# Patient Record
Sex: Female | Born: 1970 | Race: Black or African American | Hispanic: No | Marital: Married | Smoking: Never smoker
Health system: Southern US, Community
[De-identification: ages and names within clinical notes are randomized; demographics above are authoritative.]

## PROBLEM LIST (undated history)

## (undated) DIAGNOSIS — R002 Palpitations: Secondary | ICD-10-CM

## (undated) DIAGNOSIS — A159 Respiratory tuberculosis unspecified: Secondary | ICD-10-CM

## (undated) DIAGNOSIS — D649 Anemia, unspecified: Secondary | ICD-10-CM

## (undated) HISTORY — PX: OTHER SURGICAL HISTORY: SHX169

## (undated) HISTORY — DX: Palpitations: R00.2

## (undated) HISTORY — DX: Respiratory tuberculosis unspecified: A15.9

---

## 2001-01-06 ENCOUNTER — Other Ambulatory Visit: Admission: RE | Admit: 2001-01-06 | Discharge: 2001-01-06 | Payer: Self-pay | Admitting: Obstetrics and Gynecology

## 2001-02-28 ENCOUNTER — Encounter: Payer: Self-pay | Admitting: Obstetrics and Gynecology

## 2001-02-28 ENCOUNTER — Ambulatory Visit (HOSPITAL_COMMUNITY): Admission: RE | Admit: 2001-02-28 | Discharge: 2001-02-28 | Payer: Self-pay | Admitting: Obstetrics and Gynecology

## 2001-04-24 ENCOUNTER — Emergency Department (HOSPITAL_COMMUNITY): Admission: EM | Admit: 2001-04-24 | Discharge: 2001-04-24 | Payer: Self-pay | Admitting: Emergency Medicine

## 2001-06-01 ENCOUNTER — Ambulatory Visit (HOSPITAL_COMMUNITY): Admission: RE | Admit: 2001-06-01 | Discharge: 2001-06-01 | Payer: Self-pay | Admitting: Obstetrics and Gynecology

## 2001-06-01 ENCOUNTER — Encounter: Payer: Self-pay | Admitting: Obstetrics and Gynecology

## 2001-07-23 ENCOUNTER — Inpatient Hospital Stay (HOSPITAL_COMMUNITY): Admission: AD | Admit: 2001-07-23 | Discharge: 2001-07-25 | Payer: Self-pay | Admitting: Obstetrics and Gynecology

## 2005-10-07 ENCOUNTER — Other Ambulatory Visit: Admission: RE | Admit: 2005-10-07 | Discharge: 2005-10-07 | Payer: Self-pay | Admitting: Obstetrics and Gynecology

## 2010-12-05 ENCOUNTER — Ambulatory Visit (HOSPITAL_COMMUNITY)
Admission: RE | Admit: 2010-12-05 | Discharge: 2010-12-05 | Payer: Self-pay | Source: Home / Self Care | Attending: Obstetrics and Gynecology | Admitting: Obstetrics and Gynecology

## 2011-01-20 ENCOUNTER — Encounter
Admission: RE | Admit: 2011-01-20 | Discharge: 2011-01-20 | Payer: Self-pay | Source: Home / Self Care | Attending: Obstetrics and Gynecology | Admitting: Obstetrics and Gynecology

## 2012-03-09 ENCOUNTER — Encounter (INDEPENDENT_AMBULATORY_CARE_PROVIDER_SITE_OTHER): Payer: Self-pay | Admitting: Obstetrics and Gynecology

## 2012-03-09 DIAGNOSIS — N898 Other specified noninflammatory disorders of vagina: Secondary | ICD-10-CM

## 2012-10-18 ENCOUNTER — Other Ambulatory Visit: Payer: Self-pay | Admitting: Obstetrics and Gynecology

## 2012-10-18 DIAGNOSIS — Z1231 Encounter for screening mammogram for malignant neoplasm of breast: Secondary | ICD-10-CM

## 2012-10-19 ENCOUNTER — Encounter: Payer: Self-pay | Admitting: Obstetrics and Gynecology

## 2012-10-19 ENCOUNTER — Ambulatory Visit (INDEPENDENT_AMBULATORY_CARE_PROVIDER_SITE_OTHER): Payer: Self-pay | Admitting: Obstetrics and Gynecology

## 2012-10-19 VITALS — BP 140/80 | HR 60 | Ht 62.0 in | Wt 185.0 lb

## 2012-10-19 DIAGNOSIS — Z124 Encounter for screening for malignant neoplasm of cervix: Secondary | ICD-10-CM

## 2012-10-19 NOTE — Progress Notes (Signed)
Last Pap: 10/14/11 WNL: Yes Regular Periods:yes Contraception: none  Monthly Breast exam:yes Tetanus<32yrs:no pt unsure Nl.Bladder Function:yes Daily BMs:no Healthy Diet:yes Calcium:yes in multivitamin  Mammogram:yes Date of Mammogram: 01/20/11 Exercise:yes Have often Exercise: daily  Seatbelt: yes Abuse at home: no Stressful work:no Sigmoid-colonoscopy: n/a Bone Density: No PCP: none Change in PMH: none Change in [facility]: none BP 140/80  Pulse 60  Ht 5\' 2"  (1.575 m)  Wt 185 lb (83.915 kg)  BMI 33.84 kg/m2  LMP 10/05/2012 Pt with complaints:no Physical Examination: General appearance - alert, well appearing, and in no distress Mental status - normal mood, behavior, speech, dress, motor activity, and thought processes Neck - supple, no significant adenopathy,  thyroid exam: thyroid is normal in size without nodules or tenderness Chest - clear to auscultation, no wheezes, rales or rhonchi, symmetric air entry Heart - normal rate and regular rhythm Abdomen - soft, nontender, nondistended, no masses or organomegaly Breasts - breasts appear normal, no suspicious masses, no skin or nipple changes or axillary nodes Pelvic - normal external genitalia, vulva, vagina, cervix, uterus and adnexa Rectal - rectal exam not indicated Back exam - full range of motion, no tenderness, palpable spasm or pain on motion Neurological - alert, oriented, normal speech, no focal findings or movement disorder noted Musculoskeletal - no joint tenderness, deformity or swelling Extremities - no edema, redness or tenderness in the calves or thighs Skin - normal coloration and turgor, no rashes, no suspicious skin lesions noted Routine exam Pap sent yes Mammogram due 1/14 nothing used for contraception.  Pt abstinant RT 1 yr

## 2012-10-20 LAB — PAP IG W/ RFLX HPV ASCU

## 2013-02-01 ENCOUNTER — Encounter (HOSPITAL_COMMUNITY): Payer: Self-pay | Admitting: *Deleted

## 2013-02-07 ENCOUNTER — Encounter (HOSPITAL_COMMUNITY): Payer: Self-pay

## 2013-02-07 ENCOUNTER — Other Ambulatory Visit: Payer: Self-pay | Admitting: Obstetrics and Gynecology

## 2013-02-07 ENCOUNTER — Ambulatory Visit (HOSPITAL_COMMUNITY)
Admission: RE | Admit: 2013-02-07 | Discharge: 2013-02-07 | Disposition: A | Discharge status: Home / Self Care | Payer: Self-pay | Source: Ambulatory Visit | Attending: Obstetrics and Gynecology | Admitting: Obstetrics and Gynecology

## 2013-02-07 VITALS — BP 120/72 | Temp 98.6°F | Ht 62.0 in | Wt 180.0 lb

## 2013-02-07 DIAGNOSIS — Z1239 Encounter for other screening for malignant neoplasm of breast: Secondary | ICD-10-CM

## 2013-02-07 DIAGNOSIS — N63 Unspecified lump in unspecified breast: Secondary | ICD-10-CM

## 2013-02-07 DIAGNOSIS — N6311 Unspecified lump in the right breast, upper outer quadrant: Secondary | ICD-10-CM | POA: Insufficient documentation

## 2013-02-07 DIAGNOSIS — R922 Inconclusive mammogram: Secondary | ICD-10-CM

## 2013-02-07 HISTORY — DX: Anemia, unspecified: D64.9

## 2013-02-07 NOTE — Patient Instructions (Signed)
Taught patient how to perform BSE. Patient did not need a Pap smear today due to last Pap smear was 10/19/2012. Let her know BCCCP will cover Pap smears every 3 years unless has a history of abnormal Pap smears. Referred patient to the [facility] for bilateral diagnostic mammogram and right breast ultrasound. Appointment scheduled for Wednesday, February 15, 2013 at 0910. Patient aware of appointment and will be there. Patient verbalized understanding.

## 2013-02-07 NOTE — Progress Notes (Signed)
No complaints today.  Pap Smear:    Pap smear not completed today. Last Pap smear was 10/19/2012 and normal. Per patient no history of abnormal Pap smears. Pap smear result above is in EPIC.  Physical exam: Breasts Breasts symmetrical. No skin abnormalities bilateral breasts. No nipple retraction bilateral breasts. No nipple discharge bilateral breasts. No lymphadenopathy. No lumps palpated left breast. Palpated small pea sized lump in right breast at 10 o'clock around 6 cm from nipple. No complaints of pain or tenderness on exam. Referred patient to the [facility] for bilateral diagnostic mammogram and right breast ultrasound. Appointment scheduled for Wednesday, February 15, 2013 at 0910.     Pelvic/Bimanual No Pap smear completed today since last Pap smear was 10/19/2012. Pap smear not indicated per BCCCP guidelines.

## 2013-02-15 ENCOUNTER — Ambulatory Visit
Admission: RE | Admit: 2013-02-15 | Discharge: 2013-02-15 | Disposition: A | Discharge status: Home / Self Care | Payer: No Typology Code available for payment source | Source: Ambulatory Visit | Attending: Obstetrics and Gynecology | Admitting: Obstetrics and Gynecology

## 2013-02-15 DIAGNOSIS — R922 Inconclusive mammogram: Secondary | ICD-10-CM

## 2013-02-15 DIAGNOSIS — N63 Unspecified lump in unspecified breast: Secondary | ICD-10-CM

## 2014-01-09 ENCOUNTER — Other Ambulatory Visit: Payer: Self-pay | Admitting: Obstetrics and Gynecology

## 2014-01-26 ENCOUNTER — Other Ambulatory Visit: Payer: Self-pay | Admitting: Obstetrics and Gynecology

## 2014-01-26 DIAGNOSIS — Z1231 Encounter for screening mammogram for malignant neoplasm of breast: Secondary | ICD-10-CM

## 2014-02-16 ENCOUNTER — Ambulatory Visit (HOSPITAL_COMMUNITY): Payer: Self-pay

## 2014-02-16 ENCOUNTER — Other Ambulatory Visit: Payer: Self-pay | Admitting: Obstetrics and Gynecology

## 2014-02-16 ENCOUNTER — Ambulatory Visit (HOSPITAL_COMMUNITY)
Admission: RE | Admit: 2014-02-16 | Discharge: 2014-02-16 | Disposition: A | Discharge status: Home / Self Care | Payer: Self-pay | Source: Ambulatory Visit | Attending: Obstetrics and Gynecology | Admitting: Obstetrics and Gynecology

## 2014-02-16 DIAGNOSIS — Z1231 Encounter for screening mammogram for malignant neoplasm of breast: Secondary | ICD-10-CM

## 2014-08-29 ENCOUNTER — Telehealth (HOSPITAL_COMMUNITY): Payer: Self-pay | Admitting: *Deleted

## 2014-08-29 NOTE — Telephone Encounter (Signed)
Patient called stating she received a bill for her mammogram on 02/16/2014. Let patient know that the bill would not be covered by BCCCP due to her BCCCP card expired and she was not seen in [place] clinic prior to mammogram. Patient received a mammogram scholarship. Gave her the phone number to Radiology at [facility] to follow-up on bill. Patient verbalized understanding.

## 2014-10-22 ENCOUNTER — Encounter (HOSPITAL_COMMUNITY): Payer: Self-pay

## 2015-02-26 ENCOUNTER — Other Ambulatory Visit (HOSPITAL_COMMUNITY): Payer: Self-pay | Admitting: Obstetrics and Gynecology

## 2015-02-26 DIAGNOSIS — Z1231 Encounter for screening mammogram for malignant neoplasm of breast: Secondary | ICD-10-CM

## 2015-03-06 ENCOUNTER — Ambulatory Visit (HOSPITAL_COMMUNITY)
Admission: RE | Admit: 2015-03-06 | Discharge: 2015-03-06 | Disposition: A | Discharge status: Home / Self Care | Payer: Self-pay | Source: Ambulatory Visit | Attending: Obstetrics and Gynecology | Admitting: Obstetrics and Gynecology

## 2015-03-06 DIAGNOSIS — Z1231 Encounter for screening mammogram for malignant neoplasm of breast: Secondary | ICD-10-CM

## 2016-08-26 ENCOUNTER — Other Ambulatory Visit: Payer: Self-pay | Admitting: Internal Medicine

## 2016-08-26 DIAGNOSIS — Z1231 Encounter for screening mammogram for malignant neoplasm of breast: Secondary | ICD-10-CM

## 2016-08-27 ENCOUNTER — Ambulatory Visit
Admission: RE | Admit: 2016-08-27 | Discharge: 2016-08-27 | Disposition: A | Discharge status: Home / Self Care | Payer: No Typology Code available for payment source | Source: Ambulatory Visit | Attending: Internal Medicine | Admitting: Internal Medicine

## 2016-08-27 DIAGNOSIS — Z1231 Encounter for screening mammogram for malignant neoplasm of breast: Secondary | ICD-10-CM

## 2016-08-31 ENCOUNTER — Other Ambulatory Visit: Payer: Self-pay | Admitting: Internal Medicine

## 2016-08-31 DIAGNOSIS — R928 Other abnormal and inconclusive findings on diagnostic imaging of breast: Secondary | ICD-10-CM

## 2016-09-14 ENCOUNTER — Other Ambulatory Visit (HOSPITAL_COMMUNITY): Payer: Self-pay | Admitting: *Deleted

## 2016-09-14 DIAGNOSIS — R928 Other abnormal and inconclusive findings on diagnostic imaging of breast: Secondary | ICD-10-CM

## 2016-09-15 ENCOUNTER — Other Ambulatory Visit (HOSPITAL_COMMUNITY): Payer: Self-pay | Admitting: *Deleted

## 2016-09-15 DIAGNOSIS — R928 Other abnormal and inconclusive findings on diagnostic imaging of breast: Secondary | ICD-10-CM

## 2016-09-21 ENCOUNTER — Other Ambulatory Visit: Payer: No Typology Code available for payment source

## 2016-09-24 ENCOUNTER — Ambulatory Visit
Admission: RE | Admit: 2016-09-24 | Discharge: 2016-09-24 | Disposition: A | Discharge status: Home / Self Care | Payer: No Typology Code available for payment source | Source: Ambulatory Visit | Attending: Obstetrics and Gynecology | Admitting: Obstetrics and Gynecology

## 2016-09-24 ENCOUNTER — Ambulatory Visit (HOSPITAL_COMMUNITY)
Admission: RE | Admit: 2016-09-24 | Discharge: 2016-09-24 | Disposition: A | Discharge status: Home / Self Care | Payer: Self-pay | Source: Ambulatory Visit | Attending: Obstetrics and Gynecology | Admitting: Obstetrics and Gynecology

## 2016-09-24 ENCOUNTER — Encounter (HOSPITAL_COMMUNITY): Payer: Self-pay

## 2016-09-24 VITALS — BP 118/76 | Temp 99.0°F | Ht 62.0 in | Wt 179.8 lb

## 2016-09-24 DIAGNOSIS — R928 Other abnormal and inconclusive findings on diagnostic imaging of breast: Secondary | ICD-10-CM

## 2016-09-24 DIAGNOSIS — Z01419 Encounter for gynecological examination (general) (routine) without abnormal findings: Secondary | ICD-10-CM

## 2016-09-24 NOTE — Patient Instructions (Signed)
Explained breast self awareness to Alicia Cameron. Let patient know that BCCCP will cover Pap smears and HPV typing every 5 years unless has a history of abnormal Pap smears. Referred patient to the [facility] for a right breast diagnostic mammogram per recommendation. Appointment scheduled for Thursday, September 24, 2016 at 1440. Let patient know will follow up with her within the next couple weeks with results of Pap smear by phone. Alicia Cameron verbalized understanding.  Chevelle Durr, Arvil Chaco, RN 3:13 PM

## 2016-09-24 NOTE — Progress Notes (Signed)
.  pap

## 2016-09-24 NOTE — Progress Notes (Signed)
Patient referred to [facility] by the [facility] due to recommending additional imagine of the right breast. Screening mammogram completed 08/27/2016.  Pap Smear: Pap smear completed today. Last Pap smear was 10/19/2012 at [facility] and normal. Per patient has no history of an abnormal Pap smear. Last Pap smear result is in EPIC.  Physical exam: Breasts Breasts symmetrical. No skin abnormalities bilateral breasts. No nipple retraction bilateral breasts. No nipple discharge bilateral breasts. No lymphadenopathy. No lumps palpated bilateral breasts. No complaints of pain or tenderness on exam. Referred patient to the [facility] for a right breast diagnostic mammogram per recommendation. Appointment scheduled for Thursday, September 24, 2016 at 1440.  Pelvic/Bimanual   Ext Genitalia No lesions, no swelling and no discharge observed on external genitalia.         Vagina Vagina pink and normal texture. No lesions or discharge observed in vagina.          Cervix Cervix is present. Cervix pink and of normal texture. No discharge observed.     Uterus Uterus is present and palpable. Uterus in normal position and normal size.        Adnexae Bilateral ovaries present and palpable. No tenderness on palpation.          Rectovaginal No rectal exam completed today since patient had no rectal complaints. No skin abnormalities observed on exam.    Smoking History: Patient has never smoked.  Patient Navigation: Patient education provided. Access to services provided for patient through [facility] program.

## 2016-09-25 LAB — CYTOLOGY - PAP

## 2016-09-28 ENCOUNTER — Encounter (HOSPITAL_COMMUNITY): Payer: Self-pay | Admitting: *Deleted

## 2016-10-15 ENCOUNTER — Telehealth (HOSPITAL_COMMUNITY): Payer: Self-pay | Admitting: *Deleted

## 2016-10-15 NOTE — Telephone Encounter (Signed)
Telephoned patient at home number and patient stated received bill. Patient will bring by the Breast Center today.

## 2016-10-27 ENCOUNTER — Telehealth (HOSPITAL_COMMUNITY): Payer: Self-pay | Admitting: *Deleted

## 2016-10-27 NOTE — Telephone Encounter (Signed)
Telephoned patient at home number and discussed negative pap smear results. HPV was negative. Next pap smear due in five years. Patient voiced understanding.

## 2017-11-16 ENCOUNTER — Other Ambulatory Visit: Payer: Self-pay | Admitting: Internal Medicine

## 2017-11-16 DIAGNOSIS — Z139 Encounter for screening, unspecified: Secondary | ICD-10-CM

## 2017-12-03 ENCOUNTER — Other Ambulatory Visit: Payer: Self-pay | Admitting: Obstetrics and Gynecology

## 2017-12-03 DIAGNOSIS — Z1231 Encounter for screening mammogram for malignant neoplasm of breast: Secondary | ICD-10-CM

## 2018-01-06 ENCOUNTER — Encounter (HOSPITAL_COMMUNITY): Payer: Self-pay

## 2018-01-06 ENCOUNTER — Ambulatory Visit (HOSPITAL_COMMUNITY)
Admission: RE | Admit: 2018-01-06 | Discharge: 2018-01-06 | Disposition: A | Discharge status: Home / Self Care | Payer: Self-pay | Source: Ambulatory Visit | Attending: Obstetrics and Gynecology | Admitting: Obstetrics and Gynecology

## 2018-01-06 ENCOUNTER — Ambulatory Visit
Admission: RE | Admit: 2018-01-06 | Discharge: 2018-01-06 | Disposition: A | Discharge status: Home / Self Care | Payer: No Typology Code available for payment source | Source: Ambulatory Visit | Attending: Obstetrics and Gynecology | Admitting: Obstetrics and Gynecology

## 2018-01-06 VITALS — Ht 62.0 in | Wt 174.6 lb

## 2018-01-06 DIAGNOSIS — Z1239 Encounter for other screening for malignant neoplasm of breast: Secondary | ICD-10-CM

## 2018-01-06 DIAGNOSIS — Z1231 Encounter for screening mammogram for malignant neoplasm of breast: Secondary | ICD-10-CM

## 2018-01-06 NOTE — Progress Notes (Signed)
No complaints today.   Pap Smear: Pap smear not completed today. Last Pap smear was 09/24/2016 at [facility] and normal with negative HPV. Per patient has no history of an abnormal Pap smear. Last Pap smear result is in Epic.  Physical exam: Breasts Breasts symmetrical. No skin abnormalities bilateral breasts. No nipple retraction bilateral breasts. No nipple discharge bilateral breasts. No lymphadenopathy. No lumps palpated bilateral breasts. No complaints of pain or tenderness on exam. Referred patient to the [facility] for a screening mammogram Appointment scheduled for Thursday, January 06, 2018 at 1540.        Pelvic/Bimanual No Pap smear completed today since last Pap smear and HPV typing was 09/24/2016. Pap smear not indicated per BCCCP guidelines.   Smoking History: Patient has never smoked.  Patient Navigation: Patient education provided. Access to services provided for patient through [facility] program.

## 2018-01-06 NOTE — Patient Instructions (Signed)
Explained breast self awareness with Laray Anger. Patient did not need a Pap smear today due to last Pap smear and HPV typing was 09/24/2016. Let her know BCCCP will cover Pap smears and HPV typing every 5 years unless has a history of abnormal Pap smears. Referred patient to the [facility] for a screening mammogram Appointment scheduled for Thursday, January 06, 2018 at 1540. Let patient know the Breast Center will follow up with her within the next couple weeks with results of mammogram by letter or phone. Laray Anger verbalized understanding.  Jazziel Fitzsimmons, Arvil Chaco, RN 3:29 PM

## 2018-01-10 ENCOUNTER — Encounter (HOSPITAL_COMMUNITY): Payer: Self-pay | Admitting: *Deleted

## 2019-02-23 ENCOUNTER — Other Ambulatory Visit: Payer: Self-pay | Admitting: Obstetrics and Gynecology

## 2019-02-23 DIAGNOSIS — Z1231 Encounter for screening mammogram for malignant neoplasm of breast: Secondary | ICD-10-CM

## 2019-03-22 ENCOUNTER — Inpatient Hospital Stay: Admission: RE | Admit: 2019-03-22 | Payer: No Typology Code available for payment source | Source: Ambulatory Visit

## 2019-06-20 ENCOUNTER — Ambulatory Visit
Admission: RE | Admit: 2019-06-20 | Discharge: 2019-06-20 | Disposition: A | Discharge status: Home / Self Care | Payer: No Typology Code available for payment source | Source: Ambulatory Visit | Attending: Obstetrics and Gynecology | Admitting: Obstetrics and Gynecology

## 2019-06-20 ENCOUNTER — Other Ambulatory Visit: Payer: Self-pay

## 2019-06-20 DIAGNOSIS — Z1231 Encounter for screening mammogram for malignant neoplasm of breast: Secondary | ICD-10-CM

## 2019-06-21 ENCOUNTER — Other Ambulatory Visit: Payer: Self-pay | Admitting: Obstetrics and Gynecology

## 2019-06-21 DIAGNOSIS — R928 Other abnormal and inconclusive findings on diagnostic imaging of breast: Secondary | ICD-10-CM

## 2019-06-26 ENCOUNTER — Telehealth (HOSPITAL_COMMUNITY): Payer: Self-pay

## 2019-06-26 NOTE — Telephone Encounter (Signed)
Telephoned patient at home. Left message with return info to schedule with BCCCP.

## 2019-06-28 ENCOUNTER — Other Ambulatory Visit (HOSPITAL_COMMUNITY): Payer: Self-pay | Admitting: *Deleted

## 2019-06-28 DIAGNOSIS — R928 Other abnormal and inconclusive findings on diagnostic imaging of breast: Secondary | ICD-10-CM

## 2019-06-29 ENCOUNTER — Other Ambulatory Visit: Payer: Self-pay

## 2019-06-29 ENCOUNTER — Encounter (HOSPITAL_COMMUNITY): Payer: Self-pay

## 2019-06-29 ENCOUNTER — Encounter (HOSPITAL_COMMUNITY): Payer: Self-pay | Admitting: *Deleted

## 2019-06-29 ENCOUNTER — Ambulatory Visit (HOSPITAL_COMMUNITY)
Admission: RE | Admit: 2019-06-29 | Discharge: 2019-06-29 | Disposition: A | Discharge status: Home / Self Care | Payer: No Typology Code available for payment source | Source: Ambulatory Visit | Attending: Obstetrics and Gynecology | Admitting: Obstetrics and Gynecology

## 2019-06-29 DIAGNOSIS — Z1239 Encounter for other screening for malignant neoplasm of breast: Secondary | ICD-10-CM | POA: Insufficient documentation

## 2019-06-29 NOTE — Patient Instructions (Addendum)
Explained breast self awareness with Laray Anger. Patient did not need a Pap smear today due to last Pap smear and HPV typing was 09/24/2016 per patient. Let her know BCCCP will cover Pap smears and HPV typing every 5 years unless has a history of abnormal Pap smears. Referred patient to the [facility] for a left breast ultrasound per recommendation. Appointment scheduled for Monday, July 03, 2019 at 1350.    Patient aware of appointment and will be there. Laray Anger verbalized understanding.  Morrell Fluke, Arvil Chaco, RN 9:18 AM

## 2019-06-29 NOTE — Progress Notes (Signed)
Patient referred to [facility] by the [facility] due to recommending additional imaging of the left breast. Screening mammogram completed 06/20/2019.  Pap Smear: Pap smear not completed today. Last Pap smear was 09/24/2016 at [facility] and normal with negative HPV. Per patient has no history of an abnormal Pap smear. Last Pap smear result is in Epic.  Physical exam: Breasts Breasts symmetrical. No skin abnormalities bilateral breasts. No nipple retraction bilateral breasts. No nipple discharge bilateral breasts. No lymphadenopathy. No lumps palpated bilateral breasts. No complaints of pain or tenderness on exam. Referred patient to the [facility] for a left breast ultrasound per recommendation. Appointment scheduled for Monday, July 03, 2019 at 1350.        Pelvic/Bimanual No Pap smear completed today since last Pap smear and HPV typing was 09/24/2016. Pap smear not indicated per BCCCP guidelines.   Smoking History: Patient has never smoked.  Patient Navigation: Patient education provided. Access to services provided for patient through BCCCP program.   Breast and Cervical Cancer Risk Assessment: Patient has a family history of her paternal grandmother and a paternal aunt having breast cancer. Patient has no known genetic mutations or history of radiation treatment to the chest before age 77. Patient has no history of cervical dysplasia, immunocompromised, or DES exposure in-utero.  Risk Assessment    Risk Scores      06/29/2019   Last edited by: Armond Hang, LPN   5-year risk: 1 %   Lifetime risk: 8.9 %

## 2019-07-03 ENCOUNTER — Ambulatory Visit
Admission: RE | Admit: 2019-07-03 | Discharge: 2019-07-03 | Disposition: A | Discharge status: Home / Self Care | Payer: No Typology Code available for payment source | Source: Ambulatory Visit | Attending: Obstetrics and Gynecology | Admitting: Obstetrics and Gynecology

## 2019-07-03 DIAGNOSIS — R928 Other abnormal and inconclusive findings on diagnostic imaging of breast: Secondary | ICD-10-CM

## 2020-06-27 ENCOUNTER — Other Ambulatory Visit: Payer: Self-pay | Admitting: Family Medicine

## 2020-06-27 ENCOUNTER — Ambulatory Visit
Admission: RE | Admit: 2020-06-27 | Discharge: 2020-06-27 | Disposition: A | Discharge status: Home / Self Care | Payer: 59 | Source: Ambulatory Visit | Attending: Family Medicine | Admitting: Family Medicine

## 2020-06-27 ENCOUNTER — Other Ambulatory Visit: Payer: Self-pay

## 2020-06-27 ENCOUNTER — Ambulatory Visit
Admission: RE | Admit: 2020-06-27 | Discharge: 2020-06-27 | Disposition: A | Discharge status: Home / Self Care | Payer: No Typology Code available for payment source | Source: Ambulatory Visit | Attending: Family Medicine | Admitting: Family Medicine

## 2020-06-27 DIAGNOSIS — N6320 Unspecified lump in the left breast, unspecified quadrant: Secondary | ICD-10-CM

## 2020-07-29 ENCOUNTER — Encounter: Payer: Self-pay | Admitting: Gastroenterology

## 2020-09-27 ENCOUNTER — Ambulatory Visit (AMBULATORY_SURGERY_CENTER): Payer: Self-pay | Admitting: *Deleted

## 2020-09-27 ENCOUNTER — Other Ambulatory Visit: Payer: Self-pay

## 2020-09-27 VITALS — Ht 62.0 in | Wt 178.0 lb

## 2020-09-27 DIAGNOSIS — Z1211 Encounter for screening for malignant neoplasm of colon: Secondary | ICD-10-CM

## 2020-09-27 MED ORDER — SUPREP BOWEL PREP KIT 17.5-3.13-1.6 GM/177ML PO SOLN: 1.0000 | Freq: Once | ORAL | 0 refills | Status: AC

## 2020-09-27 NOTE — Progress Notes (Signed)
cov vax x 2   No egg or soy allergy known to patient  No issues with past sedation with any surgeries or procedures no intubation problems in the past  No FH of Malignant Hyperthermia No diet pills per patient No home 02 use per patient  No blood thinners per patient  Pt denies issues with constipation  No A fib or A flutter  EMMI video to pt or via [place] 19 guidelines implemented in [place] today with Pt and RN   Pt needs to take her [place] [facility] to [facility] to get her prep- Instructed her if the pharmacy calls and tells her the prep is 90-100 + dollars, let them know she needs to bring the insurance card to get the prep at a 0$ co pay   Due to the COVID-19 pandemic we are asking patients to follow these guidelines. Please only bring one care partner. Please be aware that your care partner may wait in the car in the parking lot or if they feel like they will be too hot to wait in the car, they may wait in the lobby on the 4th floor. All care partners are required to wear a mask the entire time (we do not have any that we can provide them), they need to practice social distancing, and we will do a Covid check for all patient's and care partners when you arrive. Also we will check their temperature and your temperature. If the care partner waits in their car they need to stay in the parking lot the entire time and we will call them on their cell phone when the patient is ready for discharge so they can bring the car to the front of the building. Also all patient's will need to wear a mask into building.

## 2020-10-01 ENCOUNTER — Telehealth: Payer: Self-pay | Admitting: Gastroenterology

## 2020-10-01 NOTE — Telephone Encounter (Signed)
Spoke to patient and she would like to try Sutab prep. I left a sample Sutab with the new instructions at the front desk and she will pick them up

## 2020-10-01 NOTE — Telephone Encounter (Signed)
Attempted to call pt-to remind her  at [facility] pt had not taken her insurance card to the pharmacy- we discussed in [place] that she needs to take the card for her prep to be covered-  No answer, no voice mail to [facility]

## 2020-10-01 NOTE — Telephone Encounter (Signed)
Called pt- she wanted to verify pills and water amounts- we discussed prep - she verbalized understanding

## 2020-10-11 ENCOUNTER — Encounter: Payer: Self-pay | Admitting: Gastroenterology

## 2020-10-11 ENCOUNTER — Ambulatory Visit (AMBULATORY_SURGERY_CENTER): Payer: 59 | Admitting: Gastroenterology

## 2020-10-11 ENCOUNTER — Other Ambulatory Visit: Payer: Self-pay

## 2020-10-11 VITALS — BP 139/68 | HR 42 | Temp 97.3°F | Resp 24 | Ht 62.0 in | Wt 178.0 lb

## 2020-10-11 DIAGNOSIS — Z1211 Encounter for screening for malignant neoplasm of colon: Secondary | ICD-10-CM

## 2020-10-11 DIAGNOSIS — D122 Benign neoplasm of ascending colon: Secondary | ICD-10-CM

## 2020-10-11 DIAGNOSIS — K6389 Other specified diseases of intestine: Secondary | ICD-10-CM | POA: Diagnosis not present

## 2020-10-11 DIAGNOSIS — Z8371 Family history of colonic polyps: Secondary | ICD-10-CM

## 2020-10-11 DIAGNOSIS — K635 Polyp of colon: Secondary | ICD-10-CM | POA: Diagnosis not present

## 2020-10-11 MED ORDER — SODIUM CHLORIDE 0.9 % IV SOLN: 500.0000 mL | Freq: Once | INTRAVENOUS | Status: DC

## 2020-10-11 NOTE — Progress Notes (Signed)
Called to room to assist during endoscopic procedure.  Patient ID and intended procedure confirmed with present staff. Received instructions for my participation in the procedure from the performing physician.  

## 2020-10-11 NOTE — Progress Notes (Signed)
Pt's states no medical or surgical changes since previsit or office visit. 

## 2020-10-11 NOTE — Progress Notes (Signed)
PT taken to PACU. Monitors in place. VSS. Report given to RN. 

## 2020-10-11 NOTE — Op Note (Signed)
[facility] Patient Name: Alicia Cameron Procedure Date: 10/11/2020 10:33 AM MRN: [id number] Endoscopist: Thornton Park MD, MD Age: 49 Referring MD:  Date of Birth: 1971/08/02 Gender: Female Account #: [id number] Procedure:                Colonoscopy Indications:              Screening for colorectal malignant neoplasm, This                            is the patient's first colonoscopy                           Brother and sister with colon polyps Medicines:                Monitored Anesthesia Care Procedure:                Pre-Anesthesia Assessment:                           - Prior to the procedure, a History and Physical                            was performed, and patient medications and                            allergies were reviewed. The patient's tolerance of                            previous anesthesia was also reviewed. The risks                            and benefits of the procedure and the sedation                            options and risks were discussed with the patient.                            All questions were answered, and informed consent                            was obtained. Prior Anticoagulants: The patient has                            taken no previous anticoagulant or antiplatelet                            agents. ASA Grade Assessment: II - A patient with                            mild systemic disease. After reviewing the risks                            and benefits, the patient was deemed in  satisfactory condition to undergo the procedure.                           After obtaining informed consent, the colonoscope                            was passed under direct vision. Throughout the                            procedure, the patient's blood pressure, pulse, and                            oxygen saturations were monitored continuously. The                            Colonoscope was introduced  through the anus and                            advanced to the the cecum, identified by                            appendiceal orifice and ileocecal valve. The                            colonoscopy was performed with moderate difficulty                            due to a redundant colon, significant looping and a                            tortuous colon. Successful completion of the                            procedure was aided by applying abdominal pressure.                            The patient tolerated the procedure well. The                            quality of the bowel preparation was good. The                            terminal ileum, ileocecal valve, appendiceal                            orifice, and rectum were photographed. Scope In: 10:43:50 AM Scope Out: 10:57:01 AM Scope Withdrawal Time: 0 hours 8 minutes 21 seconds  Total Procedure Duration: 0 hours 13 minutes 11 seconds  Findings:                 The perianal and digital rectal examinations were                            normal.  A 4-5 mm polyp was found in the mid ascending                            colon. The polyp was pedunculated. The polyp was                            removed with a cold snare. Resection and retrieval                            were complete. Estimated blood loss was minimal.                           The exam was otherwise without abnormality on                            direct and retroflexion views. Complications:            No immediate complications. Estimated blood loss:                            Minimal. Estimated Blood Loss:     Estimated blood loss was minimal. Impression:               - One 4-5 mm polyp in the mid ascending colon,                            removed with a cold snare. Resected and retrieved.                           - The examination was otherwise normal on direct                            and retroflexion views. Recommendation:            - Patient has a contact number available for                            emergencies. The signs and symptoms of potential                            delayed complications were discussed with the                            patient. Return to normal activities tomorrow.                            Written discharge instructions were provided to the                            patient.                           - Resume previous diet.                           - Continue  present medications.                           - Await pathology results.                           - Repeat colonoscopy date to be determined after                            pending pathology results are reviewed for                            surveillance. Given your family history, this                            should not be more than 5 years.                           - Emerging evidence supports eating a diet of                            fruits, vegetables, grains, calcium, and yogurt                            while reducing red meat and alcohol may reduce the                            risk of colon cancer.                           - Thank you for allowing me to be involved in your                            colon cancer prevention. Thornton Park MD, MD 10/11/2020 11:04:49 AM This report has been signed electronically.

## 2020-10-11 NOTE — Patient Instructions (Addendum)
Handouts Provided:  Polyps  YOU HAD AN ENDOSCOPIC PROCEDURE TODAY AT THE [facility]:   Refer to the procedure report that was given to you for any specific questions about what was found during the examination.  If the procedure report does not answer your questions, please call your gastroenterologist to clarify.  If you requested that your care partner not be given the details of your procedure findings, then the procedure report has been included in a sealed envelope for you to review at your convenience later.  YOU SHOULD EXPECT: Some feelings of bloating in the abdomen. Passage of more gas than usual.  Walking can help get rid of the air that was put into your GI tract during the procedure and reduce the bloating. If you had a lower endoscopy (such as a colonoscopy or flexible sigmoidoscopy) you may notice spotting of blood in your stool or on the toilet paper. If you underwent a bowel prep for your procedure, you may not have a normal bowel movement for a few days.  Please Note:  You might notice some irritation and congestion in your nose or some drainage.  This is from the oxygen used during your procedure.  There is no need for concern and it should clear up in a day or so.  SYMPTOMS TO REPORT IMMEDIATELY:   Following lower endoscopy (colonoscopy or flexible sigmoidoscopy):  Excessive amounts of blood in the stool  Significant tenderness or worsening of abdominal pains  Swelling of the abdomen that is new, acute  Fever of 100F or higher  For urgent or emergent issues, a gastroenterologist can be reached at any hour by calling [phone]. Do not use MyChart messaging for urgent concerns.    DIET:  We do recommend a small meal at first, but then you may proceed to your regular diet.  Drink plenty of fluids but you should avoid alcoholic beverages for 24 hours.  ACTIVITY:  You should plan to take it easy for the rest of today and you should NOT DRIVE or use heavy  machinery until tomorrow (because of the sedation medicines used during the test).    FOLLOW UP: Our staff will call the number listed on your records 48-72 hours following your procedure to check on you and address any questions or concerns that you may have regarding the information given to you following your procedure. If we do not reach you, we will leave a message.  We will attempt to reach you two times.  During this call, we will ask if you have developed any symptoms of COVID 19. If you develop any symptoms (ie: fever, flu-like symptoms, shortness of breath, cough etc.) before then, please call [phone].  If you test positive for Covid 19 in the 2 weeks post procedure, please call and report this information to us.    If any biopsies were taken you will be contacted by phone or by letter within the next 1-3 weeks.  Please call us at [phone] if you have not heard about the biopsies in 3 weeks.    SIGNATURES/CONFIDENTIALITY: You and/or your care partner have signed paperwork which will be entered into your electronic medical record.  These signatures attest to the fact that that the information above on your After Visit Summary has been reviewed and is understood.  Full responsibility of the confidentiality of this discharge information lies with you and/or your care-partner.  

## 2020-10-15 ENCOUNTER — Telehealth: Payer: Self-pay

## 2020-10-15 NOTE — Telephone Encounter (Signed)
  Follow up Call-  Call back number 10/11/2020  Post procedure Call Back phone  # [phone]  Permission to leave phone message Yes  Some recent data might be hidden     Patient questions:  Do you have a fever, pain , or abdominal swelling? No. Pain Score  0 *  Have you tolerated food without any problems? Yes.    Have you been able to return to your normal activities? Yes.    Do you have any questions about your discharge instructions: Diet   No. Medications  No. Follow up visit  No.  Do you have questions or concerns about your Care? No.  Actions: * If pain score is 4 or above: 1. No action needed, pain <4.Have you developed a fever since your procedure? no  2.   Have you had an respiratory symptoms (SOB or cough) since your procedure? no  3.   Have you tested positive for COVID 19 since your procedure no  4.   Have you had any family members/close contacts diagnosed with the COVID 19 since your procedure?  no   If yes to any of these questions please route to Joylene John, RN and Joella Prince, RN

## 2020-10-21 ENCOUNTER — Encounter: Payer: Self-pay | Admitting: Gastroenterology

## 2020-11-08 ENCOUNTER — Telehealth: Payer: Self-pay | Admitting: Gastroenterology

## 2020-11-08 NOTE — Telephone Encounter (Signed)
Results letter discovered with pt and questions answered.

## 2020-11-08 NOTE — Telephone Encounter (Signed)
Patient is requesting path results 

## 2021-06-03 ENCOUNTER — Other Ambulatory Visit: Payer: Self-pay | Admitting: Family Medicine

## 2021-06-03 DIAGNOSIS — Z1231 Encounter for screening mammogram for malignant neoplasm of breast: Secondary | ICD-10-CM

## 2021-07-25 ENCOUNTER — Other Ambulatory Visit: Payer: Self-pay

## 2021-07-25 ENCOUNTER — Ambulatory Visit
Admission: RE | Admit: 2021-07-25 | Discharge: 2021-07-25 | Disposition: A | Discharge status: Home / Self Care | Payer: 59 | Source: Ambulatory Visit | Attending: Family Medicine | Admitting: Family Medicine

## 2021-07-25 DIAGNOSIS — Z1231 Encounter for screening mammogram for malignant neoplasm of breast: Secondary | ICD-10-CM

## 2022-07-13 ENCOUNTER — Other Ambulatory Visit: Payer: Self-pay | Admitting: Family Medicine

## 2022-08-12 ENCOUNTER — Other Ambulatory Visit: Payer: Self-pay | Admitting: Family Medicine

## 2022-08-12 DIAGNOSIS — Z1231 Encounter for screening mammogram for malignant neoplasm of breast: Secondary | ICD-10-CM

## 2022-09-04 ENCOUNTER — Ambulatory Visit
Admission: RE | Admit: 2022-09-04 | Discharge: 2022-09-04 | Disposition: A | Discharge status: Home / Self Care | Payer: BLUE CROSS/BLUE SHIELD | Source: Ambulatory Visit | Attending: Family Medicine | Admitting: Family Medicine

## 2022-09-04 DIAGNOSIS — Z1231 Encounter for screening mammogram for malignant neoplasm of breast: Secondary | ICD-10-CM

## 2023-05-23 IMAGING — MG DIGITAL SCREENING BILAT W/ CAD
4 series · 4 of 4 positions shown · non-contrast
Comparison: Previous exam(s).

CLINICAL DATA: Screening.

EXAM:
DIGITAL SCREENING BILATERAL MAMMOGRAM WITH CAD
TECHNIQUE: Bilateral screening digital craniocaudal and mediolateral oblique
mammograms were obtained. The images were evaluated with
computer-aided detection.

[R MLO]
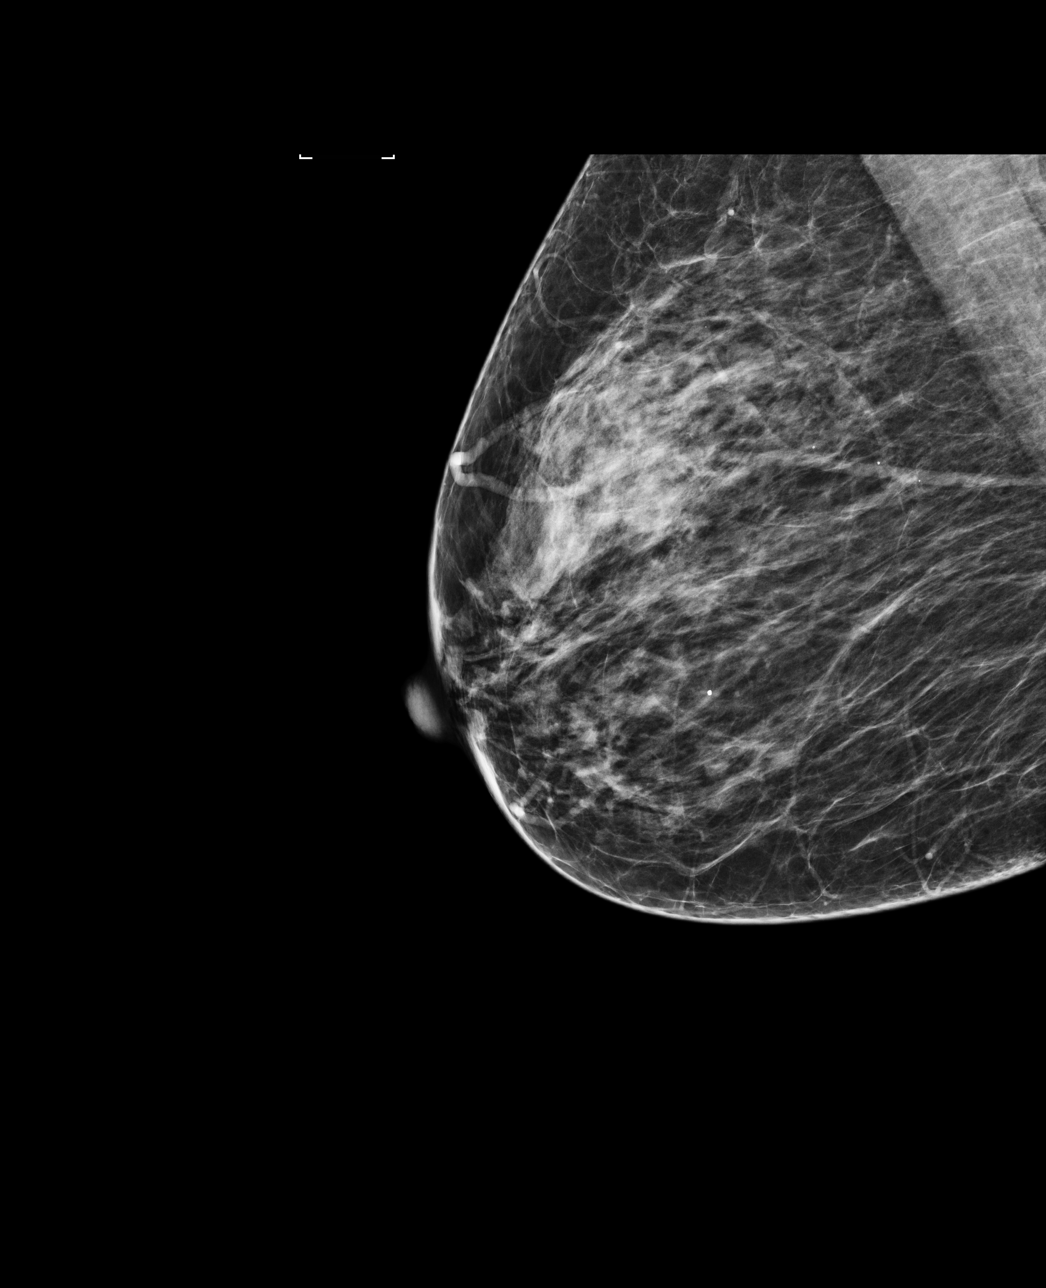

[R CC]
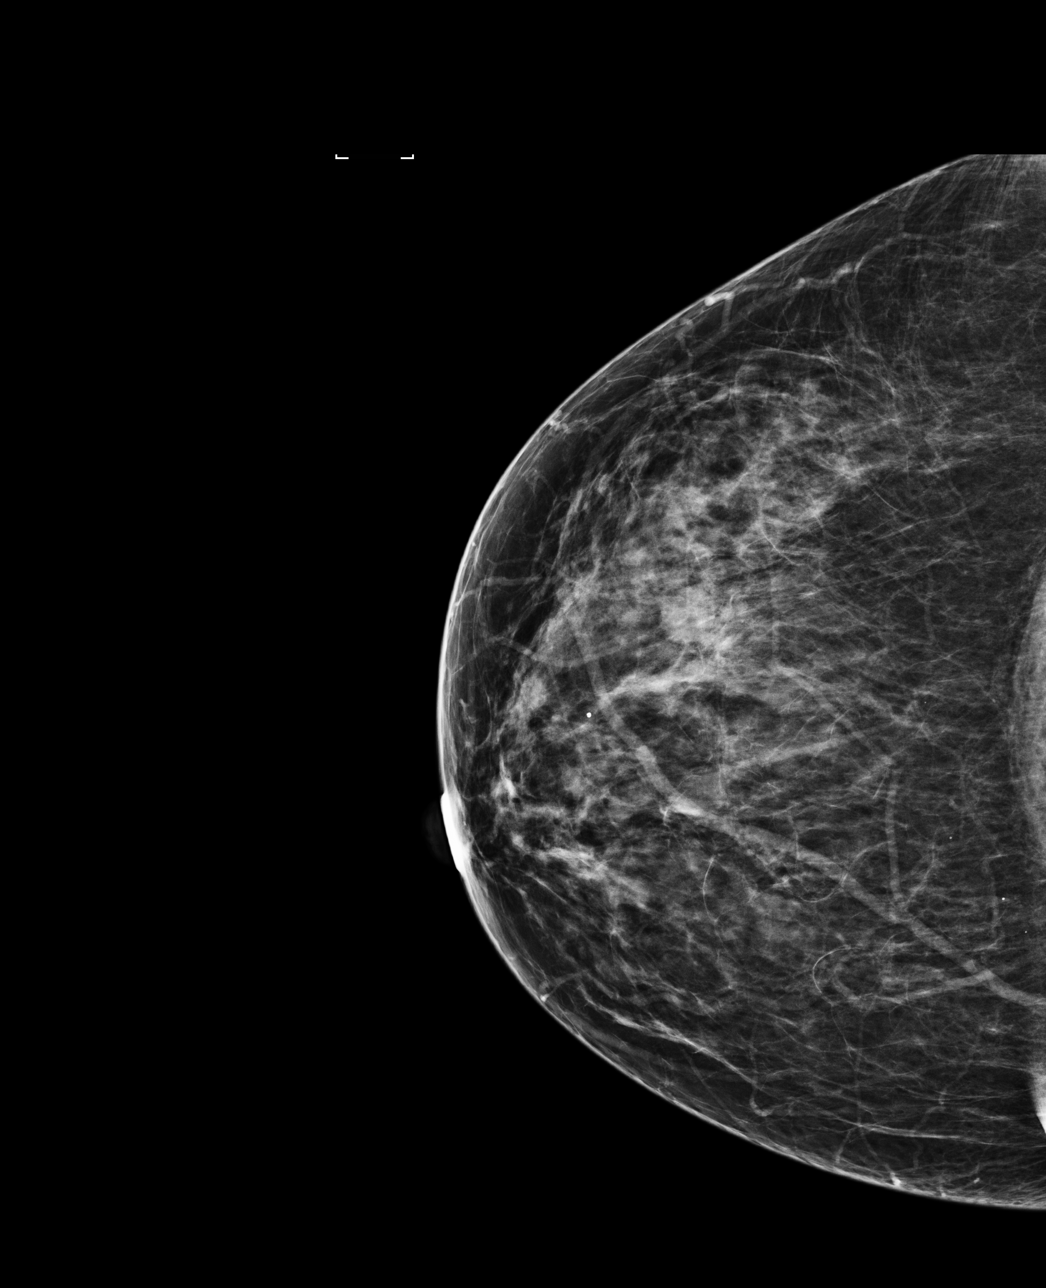

[L MLO]
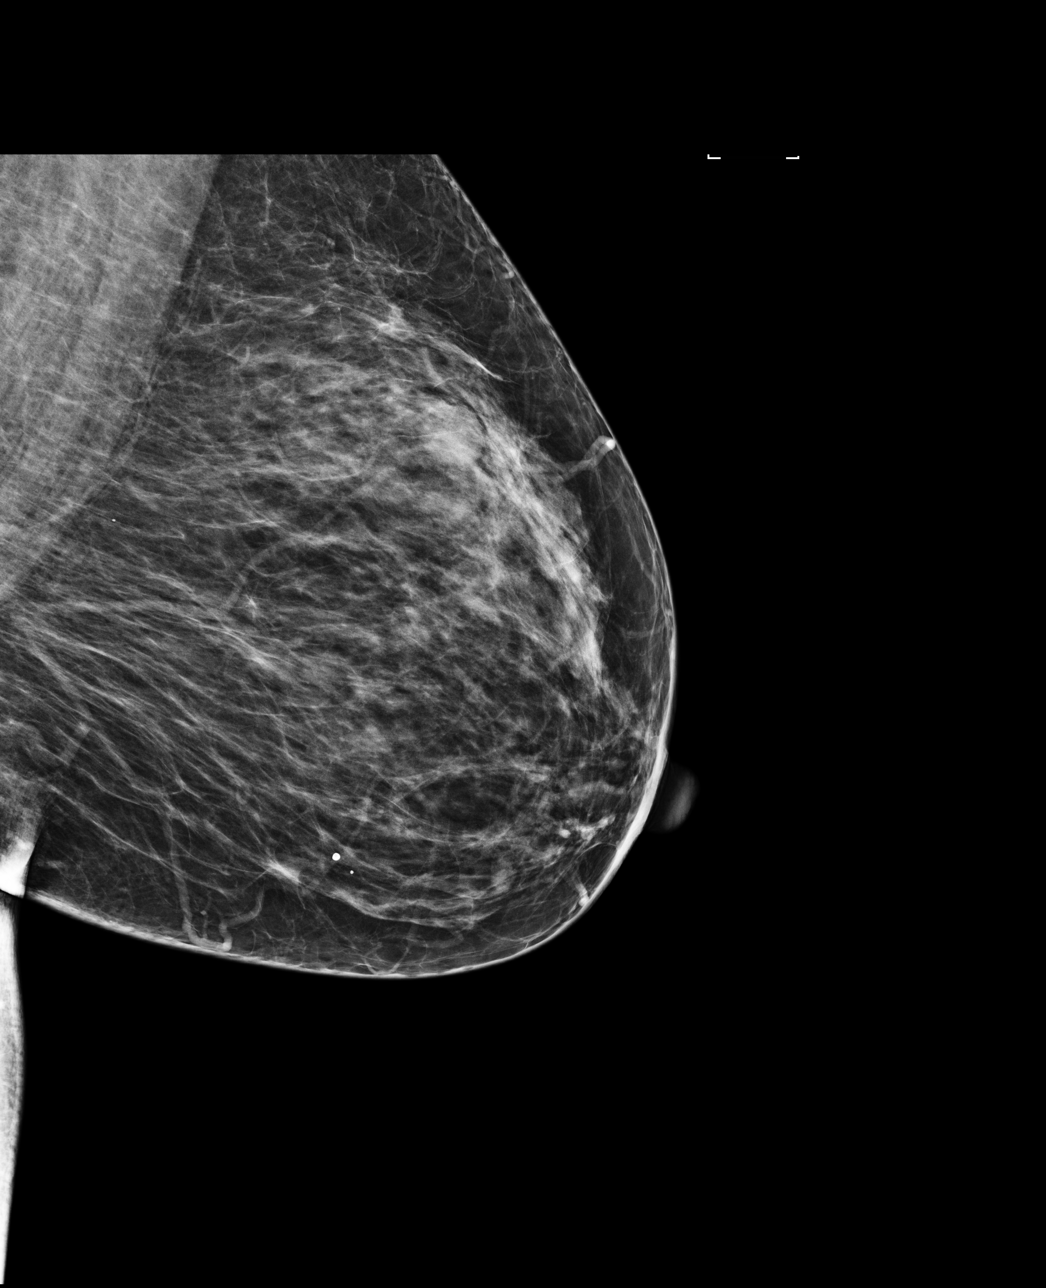

[L CC]
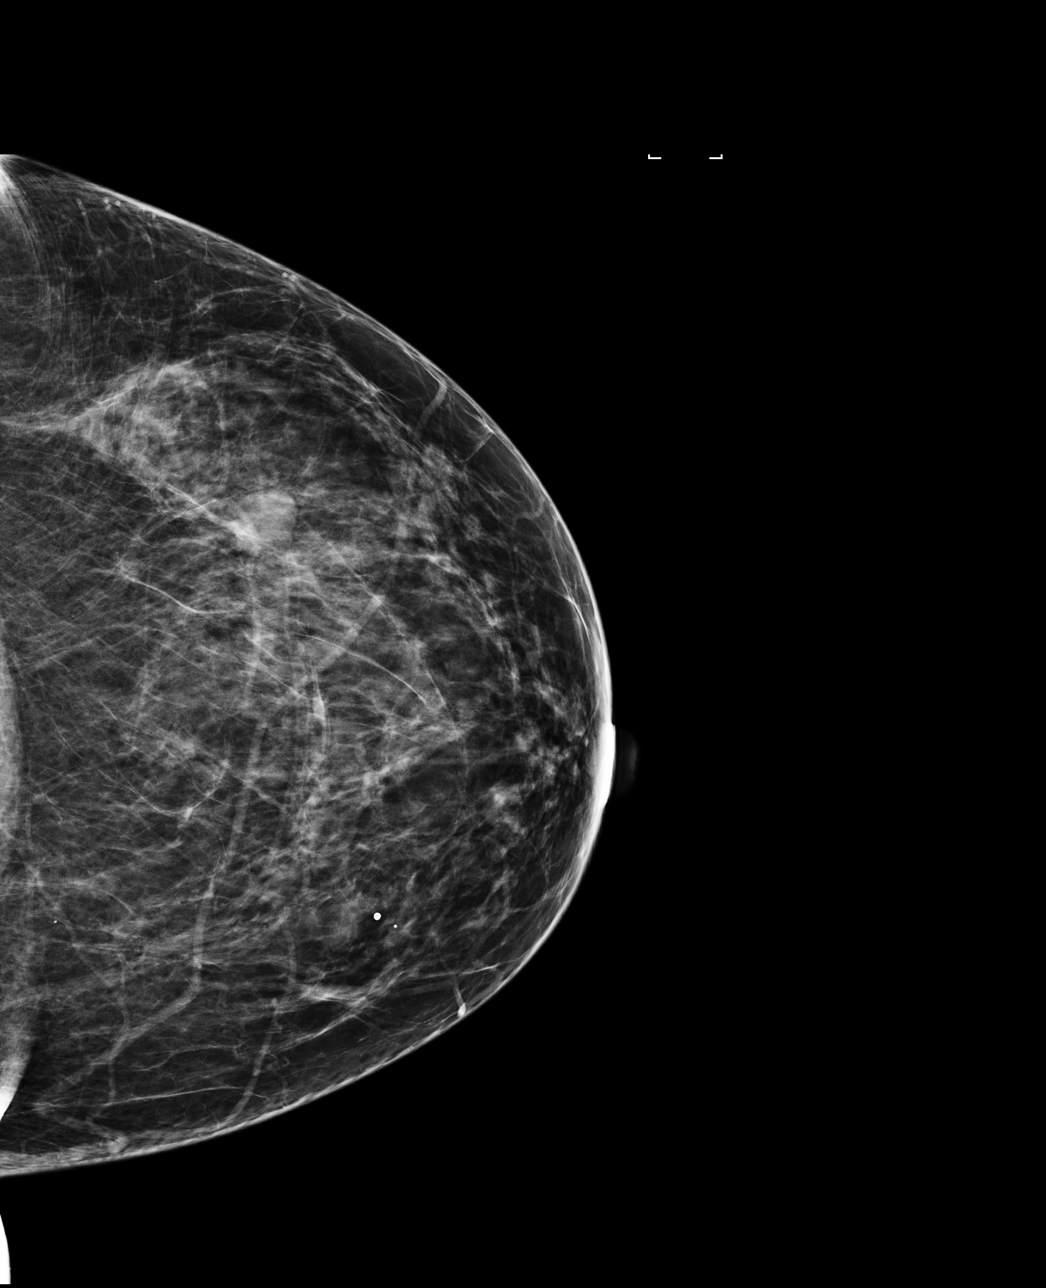

[4 of 4 positions shown; findings below may reference images not displayed]

ACR Breast Density Category c: The breast tissue is heterogeneously
dense, which may obscure small masses.
FINDINGS: There are no findings suspicious for malignancy.
IMPRESSION: No mammographic evidence of malignancy. A result letter of this
screening mammogram will be mailed directly to the patient.

RECOMMENDATION:
Screening mammogram in one year. (Code:TJ-B-ZPA)

BI-RADS CATEGORY  1: Negative.
